# Patient Record
Sex: Male | Born: 1995
Health system: Southern US, Community
[De-identification: ages and names within clinical notes are randomized; demographics above are authoritative.]

## PROBLEM LIST (undated history)

## (undated) ENCOUNTER — Ambulatory Visit: Admission: EM

## (undated) HISTORY — PX: INGUINAL HERNIA REPAIR: SUR1180

## (undated) HISTORY — PX: WRIST FRACTURE SURGERY: SHX121

## (undated) HISTORY — PX: GANGLION CYST EXCISION: SHX1691

---

## 2018-08-15 ENCOUNTER — Encounter: Payer: Self-pay | Admitting: Emergency Medicine

## 2018-08-15 ENCOUNTER — Other Ambulatory Visit: Payer: Self-pay

## 2018-08-15 ENCOUNTER — Emergency Department
Admission: EM | Admit: 2018-08-15 | Discharge: 2018-08-15 | Disposition: A | Discharge status: Home / Self Care | Payer: Federal, State, Local not specified - PPO | Source: Home / Self Care

## 2018-08-15 ENCOUNTER — Other Ambulatory Visit (HOSPITAL_COMMUNITY)
Admission: RE | Admit: 2018-08-15 | Discharge: 2018-08-15 | Disposition: A | Discharge status: Home / Self Care | Payer: Federal, State, Local not specified - PPO | Source: Ambulatory Visit | Attending: Family Medicine | Admitting: Family Medicine

## 2018-08-15 DIAGNOSIS — N3000 Acute cystitis without hematuria: Secondary | ICD-10-CM | POA: Insufficient documentation

## 2018-08-15 DIAGNOSIS — R300 Dysuria: Secondary | ICD-10-CM | POA: Diagnosis not present

## 2018-08-15 DIAGNOSIS — Z20828 Contact with and (suspected) exposure to other viral communicable diseases: Secondary | ICD-10-CM | POA: Diagnosis not present

## 2018-08-15 LAB — POCT URINALYSIS DIP (MANUAL ENTRY)
Bilirubin, UA: NEGATIVE
Blood, UA: NEGATIVE
Glucose, UA: NEGATIVE mg/dL
Ketones, POC UA: NEGATIVE mg/dL
Nitrite, UA: NEGATIVE
Protein Ur, POC: NEGATIVE mg/dL
Spec Grav, UA: 1.015 (ref 1.010–1.025)
Urobilinogen, UA: 0.2 E.U./dL
pH, UA: 6.5 (ref 5.0–8.0)

## 2018-08-15 MED ORDER — DOXYCYCLINE HYCLATE 100 MG PO CAPS: 100.0000 mg | ORAL_CAPSULE | Freq: Two times a day (BID) | ORAL | 0 refills | Status: DC

## 2018-08-15 NOTE — ED Provider Notes (Signed)
Mark Lutz [facility]    CSN: [id number] Arrival date & time: 08/15/18  1232     History   Chief Complaint Chief Complaint  Patient presents with  . Dysuria  . Exposure to STD    HPI Mark Lutz is a 23 y.o. male.   HPI Mark Lutz is a 23 y.o. male presenting to UC with c/o 2 weeks of mild urinary frequency and discomfort with urination that worsened since yesterday.  No prior hx of UTIs but he took an at home test which showed positive for a UTI.  Pt also requested to be tested for gonorrhea and chlamydia but no known exposure. Denies fever, chills, n/v/d. Denies abdominal pain or back pain. No penile discharge.    History reviewed. No pertinent past medical history.  There are no active problems to display for this patient.   History reviewed. No pertinent surgical history.     Home Medications    Prior to Admission medications   Medication Sig Start Date End Date Taking? Authorizing Provider  doxycycline (VIBRAMYCIN) 100 MG capsule Take 1 capsule (100 mg total) by mouth 2 (two) times daily. One po bid x 7 days 08/15/18   Rolla Plate    Family History History reviewed. No pertinent family history.  Social History Social History   Tobacco Use  . Smoking status: Not on file  Substance Use Topics  . Alcohol use: Not on file  . Drug use: Not on file     Allergies   Patient has no known allergies.   Review of Systems Review of Systems  Constitutional: Negative for chills and fever.  Gastrointestinal: Negative for abdominal pain, nausea and vomiting.  Genitourinary: Positive for dysuria and frequency. Negative for discharge, flank pain, genital sores, hematuria, penile pain and testicular pain.  Musculoskeletal: Negative for back pain.     Physical Exam Triage Vital Signs ED Triage Vitals [08/15/18 1255]  Enc Vitals Group     BP 138/77     Pulse Rate (!) 52     Resp      Temp 98.4 F (36.9 C)     Temp Source Oral     SpO2 99 %      Weight 189 lb 14.4 oz (86.1 kg)     Height 6' (1.829 m)     Head Circumference      Peak Flow      Pain Score 0     Pain Loc      Pain Edu?      Excl. in GC?    No data found.  Updated Vital Signs BP 138/77 (BP Location: Left Arm)   Pulse (!) 52   Temp 98.4 F (36.9 C) (Oral)   Ht 6' (1.829 m)   Wt 189 lb 14.4 oz (86.1 kg)   SpO2 99%   BMI 25.76 kg/m   Visual Acuity Right Eye Distance:   Left Eye Distance:   Bilateral Distance:    Right Eye Near:   Left Eye Near:    Bilateral Near:     Physical Exam Vitals signs and nursing note reviewed.  Constitutional:      Appearance: He is well-developed.  HENT:     Head: Normocephalic and atraumatic.  Neck:     Musculoskeletal: Normal range of motion.  Cardiovascular:     Rate and Rhythm: Regular rhythm. Bradycardia present.  Pulmonary:     Effort: Pulmonary effort is normal.     Breath sounds: Normal breath  sounds.  Abdominal:     General: There is no distension.     Palpations: Abdomen is soft.     Tenderness: There is no abdominal tenderness. There is no right CVA tenderness or left CVA tenderness.  Musculoskeletal: Normal range of motion.  Skin:    General: Skin is warm and dry.  Neurological:     Mental Status: He is alert and oriented to person, place, and time.  Psychiatric:        Behavior: Behavior normal.      UC Treatments / Results  Labs (all labs ordered are listed, but only abnormal results are displayed) Labs Reviewed  POCT URINALYSIS DIP (MANUAL ENTRY) - Abnormal; Notable for the following components:      Result Value   Leukocytes, UA Moderate (2+) (*)    All other components within normal limits  URINE CULTURE  URINE CYTOLOGY ANCILLARY ONLY    EKG None  Radiology No results found.  Procedures Procedures (including critical care time)  Medications Ordered in UC Medications - No data to display  Initial Impression / Assessment and Plan / UC Course  I have reviewed the triage  vital signs and the nursing notes.  Pertinent labs & imaging results that were available during my care of the patient were reviewed by me and considered in my medical decision making (see chart for details).     UA c/w UTI Culture sent  Urine sent to test for gonorrhea and chlamydia Will start pt on doxycycline at this time.  Final Clinical Impressions(s) / UC Diagnoses   Final diagnoses:  Dysuria  Acute cystitis without hematuria     Discharge Instructions      Please take your antibiotic as prescribed. A urine culture has been sent to check the severity of your urinary infection and to determine if you are on the most appropriate antibiotic. The results should come back within 2-3 days and you will be notified even if no medication change is needed.  Please stay well hydrated and follow up with your family doctor in 1 week if not improving, sooner if worsening.  You will also be notified in 2-3 days of the results of your gonorrhea and chlamydia tests.     ED Prescriptions    Medication Sig Dispense Auth. Provider   doxycycline (VIBRAMYCIN) 100 MG capsule Take 1 capsule (100 mg total) by mouth 2 (two) times daily. One po bid x 7 days 14 capsule Lurene Shadow, [place]     Controlled Substance Prescriptions  Controlled Substance Registry consulted? Not Applicable   Rolla Plate 08/15/18 1527

## 2018-08-15 NOTE — ED Triage Notes (Addendum)
Here with freq urination with slight burn that started yesterday. Denies fever,chill or pain. Requesting STI check as well. Asymptomatic. Reports unprotected sexual contact.

## 2018-08-15 NOTE — Discharge Instructions (Addendum)
°  Please take your antibiotic as prescribed. A urine culture has been sent to check the severity of your urinary infection and to determine if you are on the most appropriate antibiotic. The results should come back within 2-3 days and you will be notified even if no medication change is needed.  Please stay well hydrated and follow up with your family doctor in 1 week if not improving, sooner if worsening.  You will also be notified in 2-3 days of the results of your gonorrhea and chlamydia tests.

## 2018-08-16 LAB — URINE CULTURE
MICRO NUMBER:: 344345
Result:: NO GROWTH
SPECIMEN QUALITY:: ADEQUATE

## 2018-08-18 LAB — URINE CYTOLOGY ANCILLARY ONLY
Chlamydia: NEGATIVE
Neisseria Gonorrhea: NEGATIVE

## 2018-08-19 ENCOUNTER — Telehealth: Payer: Self-pay | Admitting: *Deleted

## 2018-08-19 NOTE — Telephone Encounter (Signed)
Callback/LAbs : LMOM to return call to [facility].

## 2018-08-19 NOTE — Telephone Encounter (Signed)
Pt called back and given results

## 2019-02-16 ENCOUNTER — Emergency Department
Admission: EM | Admit: 2019-02-16 | Discharge: 2019-02-16 | Disposition: A | Discharge status: Home / Self Care | Payer: Federal, State, Local not specified - PPO | Source: Home / Self Care | Attending: Emergency Medicine | Admitting: Emergency Medicine

## 2019-02-16 ENCOUNTER — Other Ambulatory Visit: Payer: Self-pay

## 2019-02-16 DIAGNOSIS — K4090 Unilateral inguinal hernia, without obstruction or gangrene, not specified as recurrent: Secondary | ICD-10-CM

## 2019-02-16 DIAGNOSIS — K4190 Unilateral femoral hernia, without obstruction or gangrene, not specified as recurrent: Secondary | ICD-10-CM

## 2019-02-16 NOTE — ED Triage Notes (Signed)
Pt c/o about possible inguinal hernia x 6-7 weeks. Pain 2/10 Located to the left side of hip bone area.

## 2019-02-16 NOTE — ED Provider Notes (Signed)
Mark Lutz [facility]    CSN: [id number] Arrival date & time: 02/16/19  1210      History   Chief Complaint Chief Complaint  Patient presents with  . Inguinal Hernia    HPI Mark Lutz is a 23 y.o. male.   HPI For the past 6 to 7 weeks, has had soreness left inguinal area, worse with physical exercise, lifting and playing soccer.  Pain started 2 days ago, mildly worse, now 2 out of 10 intensity.  No radiation.  He felt a bulge over the sore area left inguinal area.  No other abdominal symptoms.  No distention, nausea, vomiting, change of bowel habits, fever, chills, chest pain, shortness of breath, rash. He denies any GU symptoms.  No dysuria or hematuria or urethral discharge.  Monogamous.  Partner asymptomatic.  He was seen for dysuria 6 months ago that since resolved without sequelae. No past medical history on file. Past medical history otherwise negative.  No history of diabetes or hypertension. There are no active problems to display for this patient.   No past surgical history on file.  Past surgical history negative   Home Medications    Prior to Admission medications   Not on File    Family History No family history on file. Family history not noted by patient Social History Social History   Tobacco Use  . Smoking status: Never Smoker  . Smokeless tobacco: Never Used  Substance Use Topics  . Alcohol use: Yes    Comment: occ  . Drug use: Not on file   Denies ever using tobacco  Allergies   Patient has no known allergies.   Review of Systems Review of Systems  All other systems reviewed and are negative.    Physical Exam Triage Vital Signs ED Triage Vitals [02/16/19 1235]  Enc Vitals Group     BP 125/73     Pulse Rate (!) 58     Resp 18     Temp 98.3 F (36.8 C)     Temp Source Oral     SpO2 99 %     Weight 191 lb (86.6 kg)     Height 6' (1.829 m)     Head Circumference      Peak Flow      Pain Score 2     Pain Loc      Pain  Edu?      Excl. in GC?    No data found.  Updated Vital Signs BP 125/73 (BP Location: Right Arm)   Pulse (!) 58   Temp 98.3 F (36.8 C) (Oral)   Resp 18   Ht 6' (1.829 m)   Wt 86.6 kg   SpO2 99%   BMI 25.90 kg/m    Physical Exam Vitals signs reviewed.  Constitutional:      General: He is not in acute distress.    Appearance: He is well-developed.     Comments: Pleasant, cooperative male, no distress  HENT:     Head: Normocephalic and atraumatic.  Eyes:     General: No scleral icterus.    Pupils: Pupils are equal, round, and reactive to light.  Neck:     Musculoskeletal: Normal range of motion and neck supple.  Cardiovascular:     Rate and Rhythm: Normal rate and regular rhythm.  Pulmonary:     Effort: Pulmonary effort is normal.  Abdominal:     General: Abdomen is flat. There is no distension.     Palpations:  Abdomen is soft.     Tenderness: There is no abdominal tenderness. There is no right CVA tenderness, left CVA tenderness, guarding or rebound.     Hernia: A hernia is present. Hernia is present in the left inguinal area. There is no hernia in the right inguinal area.     Comments: No hepatosplenomegaly. No inguinal adenopathy  Genitourinary:    Pubic Area: No rash.      Penis: Normal and circumcised. No tenderness, swelling or lesions.      Scrotum/Testes: Normal. Cremasteric reflex is present.        Right: Mass or tenderness not present.        Left: Testicular hydrocele or varicocele not present.     Epididymis:     Right: Normal.     Left: Normal.       Comments: As shown, there is a reducible bulge/hernia in the L femoral aspect, consistent with a femoral hernia or an indirect hernia. There is also a reducible hernia in the left inguinal canal that is tender. No testicular swelling or tenderness or redness or testicular mass noted. Musculoskeletal:     Right lower leg: No edema.     Left lower leg: No edema.     Comments: Full range of motion. No  bony tenderness  Skin:    General: Skin is warm and dry.     Capillary Refill: Capillary refill takes less than 2 seconds.     Findings: No rash.  Neurological:     General: No focal deficit present.     Mental Status: He is alert and oriented to person, place, and time.     Cranial Nerves: No cranial nerve deficit.  Psychiatric:        Behavior: Behavior normal.      UC Treatments / Results  Labs (all labs ordered are listed, but only abnormal results are displayed) Labs Reviewed - No data to display   Radiology No results found.  Procedures Procedures (including critical care time)  Medications Ordered in UC Medications - No data to display  Initial Impression / Assessment and Plan / UC Course  I have reviewed the triage vital signs and the nursing notes.  Pertinent labs & imaging results that were available during my care of the patient were reviewed by me and considered in my medical decision making (see chart for details).    Likely left femoral and inguinal hernia, reducible. Names and phone numbers of general surgeons given, and he will make his own referral appointment within the next few days.  Explained the importance of this and red flags to look out for and what to do if any red flags occur. He declined any prescription pain med.  May use OTC Tylenol or ibuprofen if needed. Questions invited and answered.  He voiced understanding He declined AVS  Final Clinical Impressions(s) / UC Diagnoses   Final diagnoses:  Left inguinal hernia  Left femoral hernia without obstruction or gangrene     Jacqulyn Cane, MD 02/17/19 2120

## 2019-02-18 ENCOUNTER — Ambulatory Visit: Payer: Federal, State, Local not specified - PPO | Admitting: Physician Assistant

## 2019-02-19 DIAGNOSIS — K4090 Unilateral inguinal hernia, without obstruction or gangrene, not specified as recurrent: Secondary | ICD-10-CM | POA: Diagnosis not present

## 2019-02-26 DIAGNOSIS — K4090 Unilateral inguinal hernia, without obstruction or gangrene, not specified as recurrent: Secondary | ICD-10-CM | POA: Insufficient documentation

## 2019-03-13 DIAGNOSIS — Z01818 Encounter for other preprocedural examination: Secondary | ICD-10-CM | POA: Diagnosis not present

## 2019-03-17 DIAGNOSIS — Z79899 Other long term (current) drug therapy: Secondary | ICD-10-CM | POA: Diagnosis not present

## 2019-03-17 DIAGNOSIS — K4090 Unilateral inguinal hernia, without obstruction or gangrene, not specified as recurrent: Secondary | ICD-10-CM | POA: Diagnosis not present

## 2020-07-19 DIAGNOSIS — Y998 Other external cause status: Secondary | ICD-10-CM | POA: Diagnosis not present

## 2020-07-19 DIAGNOSIS — W260XXA Contact with knife, initial encounter: Secondary | ICD-10-CM | POA: Diagnosis not present

## 2020-07-19 DIAGNOSIS — S6992XA Unspecified injury of left wrist, hand and finger(s), initial encounter: Secondary | ICD-10-CM | POA: Diagnosis not present

## 2020-09-13 DIAGNOSIS — M25531 Pain in right wrist: Secondary | ICD-10-CM | POA: Diagnosis not present

## 2020-10-11 DIAGNOSIS — M25531 Pain in right wrist: Secondary | ICD-10-CM | POA: Diagnosis not present

## 2020-11-02 ENCOUNTER — Other Ambulatory Visit: Payer: Self-pay

## 2020-11-02 ENCOUNTER — Ambulatory Visit (INDEPENDENT_AMBULATORY_CARE_PROVIDER_SITE_OTHER): Payer: Federal, State, Local not specified - PPO | Admitting: Sports Medicine

## 2020-11-02 DIAGNOSIS — M25531 Pain in right wrist: Secondary | ICD-10-CM | POA: Diagnosis not present

## 2020-11-02 DIAGNOSIS — G8929 Other chronic pain: Secondary | ICD-10-CM

## 2020-11-02 NOTE — Progress Notes (Signed)
    Procedures performed today:    None.  Independent interpretation of notes and tests performed by another provider:   None.  Brief History, Exam, Impression, and Recommendations:    Chronic pain of right wrist Mark Lutz is a pleasant 25 year old male, 1 year ago he fell onto an outstretched right hand, had immediate pain, this hurt him for over a year, he does have some mechanical symptoms, instability. He was seen by an orthopedic surgeon a few weeks ago, he had some x-rays that were unremarkable and received a STT joint injection without any improvement. He does have a positive Watson's test, he has a positive lunotriquetral shuck test. I do think he has either a scapholunate ligament tear, lunotriquetral ligament tear or both. For this reason we do need to proceed with MR arthrography. Return to see me for MR arthrogram injection.    ___________________________________________ Ihor Austin. Benjamin Stain, M.D., ABFM., CAQSM. Primary Care and Sports Medicine [facility]  Adjunct Instructor of Family Medicine  [employer]

## 2020-11-02 NOTE — Assessment & Plan Note (Signed)
Mark Lutz is a pleasant 25 year old male, 1 year ago he fell onto an outstretched right hand, had immediate pain, this hurt him for over a year, he does have some mechanical symptoms, instability. He was seen by an orthopedic surgeon a few weeks ago, he had some x-rays that were unremarkable and received a STT joint injection without any improvement. He does have a positive Watson's test, he has a positive lunotriquetral shuck test. I do think he has either a scapholunate ligament tear, lunotriquetral ligament tear or both. For this reason we do need to proceed with MR arthrography. Return to see me for MR arthrogram injection.

## 2020-11-20 ENCOUNTER — Ambulatory Visit (INDEPENDENT_AMBULATORY_CARE_PROVIDER_SITE_OTHER): Payer: Federal, State, Local not specified - PPO

## 2020-11-20 ENCOUNTER — Ambulatory Visit: Payer: Federal, State, Local not specified - PPO | Admitting: Sports Medicine

## 2020-11-20 ENCOUNTER — Other Ambulatory Visit: Payer: Self-pay

## 2020-11-20 DIAGNOSIS — G8929 Other chronic pain: Secondary | ICD-10-CM

## 2020-11-20 DIAGNOSIS — M25531 Pain in right wrist: Secondary | ICD-10-CM

## 2020-11-20 DIAGNOSIS — R600 Localized edema: Secondary | ICD-10-CM | POA: Diagnosis not present

## 2020-11-20 MED ORDER — GADOBUTROL 1 MMOL/ML IV SOLN
1.0000 mL | Freq: Once | INTRAVENOUS | Status: AC | PRN
  Administered 2020-11-20: 1 mL via INTRAVENOUS

## 2020-11-20 NOTE — Assessment & Plan Note (Addendum)
25 year old male, 1 year ago fall onto outstretched hand with immediate pain, pain over a year, recently saw an orthopedic surgeon and had an STT injection without significant improvement, due to positive exam findings including a positive Watson's test, lunotriquetral shuck test, we are concerned about lunotriquetral or scapholunate ligament injury, injection performed today for MR arthrography, treatment will depend on MRI results.  Update: I personally reviewed the MRI, there does appear to be some attenuation at the scapholunate ligament with some contrast in the intercarpal joints.  I would like hand surgery to weigh in.

## 2020-11-20 NOTE — Progress Notes (Addendum)
    Procedures performed today:    Procedure: Real-time Ultrasound Guided gadolinium contrast injection of right wrist joint, radiocarpal Device: Samsung HS60  Verbal informed consent obtained.  Time-out conducted.  Noted no overlying erythema, induration, or other signs of local infection.  Skin prepped in a sterile fashion.  Local anesthesia: Topical Ethyl chloride.  With sterile technique and under real time ultrasound guidance: I advanced a 25-gauge needle into the radiocarpal joint, I then injected 1 cc kenalog 40, 1 cc lidocaine, syringe switched and 0.05 cc gadolinium injected, syringe again switched and 1 to 2 mL of saline used to fully distend the joint. Joint visualized and capsule seen distending confirming intra-articular placement of contrast material and medication. Completed without difficulty  Advised to call if fevers/chills, erythema, induration, drainage, or persistent bleeding.  Images permanently stored in PACS Impression: Technically successful ultrasound guided gadolinium contrast injection for MR arthrography.  Please see separate MR arthrogram report.   Independent interpretation of notes and tests performed by another provider:   I personally reviewed the MRI, there does appear to be some attenuation at the scapholunate ligament with some contrast in the intercarpal joints.  Brief History, Exam, Impression, and Recommendations:    Chronic pain of right wrist 25 year old male, 1 year ago fall onto outstretched hand with immediate pain, pain over a year, recently saw an orthopedic surgeon and had an STT injection without significant improvement, due to positive exam findings including a positive Watson's test, lunotriquetral shuck test, we are concerned about lunotriquetral or scapholunate ligament injury, injection performed today for MR arthrography, treatment will depend on MRI results.  Update: I personally reviewed the MRI, there does appear to be some  attenuation at the scapholunate ligament with some contrast in the intercarpal joints.  I would like hand surgery to weigh in.    ___________________________________________ Ihor Austin. Benjamin Stain, M.D., ABFM., CAQSM. Primary Care and Sports Medicine [facility]  Adjunct Instructor of Family Medicine  [employer]

## 2020-11-22 NOTE — Addendum Note (Signed)
Addended by: Monica Becton on: 11/22/2020 11:52 AM   Modules accepted: Orders

## 2021-01-04 DIAGNOSIS — M9904 Segmental and somatic dysfunction of sacral region: Secondary | ICD-10-CM | POA: Diagnosis not present

## 2021-01-04 DIAGNOSIS — M9901 Segmental and somatic dysfunction of cervical region: Secondary | ICD-10-CM | POA: Diagnosis not present

## 2021-01-04 DIAGNOSIS — M9903 Segmental and somatic dysfunction of lumbar region: Secondary | ICD-10-CM | POA: Diagnosis not present

## 2021-01-04 DIAGNOSIS — M9902 Segmental and somatic dysfunction of thoracic region: Secondary | ICD-10-CM | POA: Diagnosis not present

## 2021-01-15 DIAGNOSIS — M47816 Spondylosis without myelopathy or radiculopathy, lumbar region: Secondary | ICD-10-CM | POA: Diagnosis not present

## 2021-01-15 DIAGNOSIS — M47818 Spondylosis without myelopathy or radiculopathy, sacral and sacrococcygeal region: Secondary | ICD-10-CM | POA: Diagnosis not present

## 2021-01-15 DIAGNOSIS — M47814 Spondylosis without myelopathy or radiculopathy, thoracic region: Secondary | ICD-10-CM | POA: Diagnosis not present

## 2021-01-15 DIAGNOSIS — M4721 Other spondylosis with radiculopathy, occipito-atlanto-axial region: Secondary | ICD-10-CM | POA: Diagnosis not present

## 2021-01-16 DIAGNOSIS — M47818 Spondylosis without myelopathy or radiculopathy, sacral and sacrococcygeal region: Secondary | ICD-10-CM | POA: Diagnosis not present

## 2021-01-16 DIAGNOSIS — M4721 Other spondylosis with radiculopathy, occipito-atlanto-axial region: Secondary | ICD-10-CM | POA: Diagnosis not present

## 2021-01-16 DIAGNOSIS — M47814 Spondylosis without myelopathy or radiculopathy, thoracic region: Secondary | ICD-10-CM | POA: Diagnosis not present

## 2021-01-16 DIAGNOSIS — M47816 Spondylosis without myelopathy or radiculopathy, lumbar region: Secondary | ICD-10-CM | POA: Diagnosis not present

## 2021-01-17 DIAGNOSIS — M47818 Spondylosis without myelopathy or radiculopathy, sacral and sacrococcygeal region: Secondary | ICD-10-CM | POA: Diagnosis not present

## 2021-01-17 DIAGNOSIS — M4721 Other spondylosis with radiculopathy, occipito-atlanto-axial region: Secondary | ICD-10-CM | POA: Diagnosis not present

## 2021-01-17 DIAGNOSIS — M47814 Spondylosis without myelopathy or radiculopathy, thoracic region: Secondary | ICD-10-CM | POA: Diagnosis not present

## 2021-01-17 DIAGNOSIS — M47816 Spondylosis without myelopathy or radiculopathy, lumbar region: Secondary | ICD-10-CM | POA: Diagnosis not present

## 2021-01-18 DIAGNOSIS — M47816 Spondylosis without myelopathy or radiculopathy, lumbar region: Secondary | ICD-10-CM | POA: Diagnosis not present

## 2021-01-18 DIAGNOSIS — M4721 Other spondylosis with radiculopathy, occipito-atlanto-axial region: Secondary | ICD-10-CM | POA: Diagnosis not present

## 2021-01-18 DIAGNOSIS — M47814 Spondylosis without myelopathy or radiculopathy, thoracic region: Secondary | ICD-10-CM | POA: Diagnosis not present

## 2021-01-18 DIAGNOSIS — M47818 Spondylosis without myelopathy or radiculopathy, sacral and sacrococcygeal region: Secondary | ICD-10-CM | POA: Diagnosis not present

## 2021-01-23 DIAGNOSIS — M47818 Spondylosis without myelopathy or radiculopathy, sacral and sacrococcygeal region: Secondary | ICD-10-CM | POA: Diagnosis not present

## 2021-01-23 DIAGNOSIS — M47814 Spondylosis without myelopathy or radiculopathy, thoracic region: Secondary | ICD-10-CM | POA: Diagnosis not present

## 2021-01-23 DIAGNOSIS — M47816 Spondylosis without myelopathy or radiculopathy, lumbar region: Secondary | ICD-10-CM | POA: Diagnosis not present

## 2021-01-23 DIAGNOSIS — M4721 Other spondylosis with radiculopathy, occipito-atlanto-axial region: Secondary | ICD-10-CM | POA: Diagnosis not present

## 2021-01-24 DIAGNOSIS — M47814 Spondylosis without myelopathy or radiculopathy, thoracic region: Secondary | ICD-10-CM | POA: Diagnosis not present

## 2021-01-24 DIAGNOSIS — M47816 Spondylosis without myelopathy or radiculopathy, lumbar region: Secondary | ICD-10-CM | POA: Diagnosis not present

## 2021-01-24 DIAGNOSIS — M47818 Spondylosis without myelopathy or radiculopathy, sacral and sacrococcygeal region: Secondary | ICD-10-CM | POA: Diagnosis not present

## 2021-01-24 DIAGNOSIS — M4721 Other spondylosis with radiculopathy, occipito-atlanto-axial region: Secondary | ICD-10-CM | POA: Diagnosis not present

## 2021-01-30 DIAGNOSIS — M47814 Spondylosis without myelopathy or radiculopathy, thoracic region: Secondary | ICD-10-CM | POA: Diagnosis not present

## 2021-01-30 DIAGNOSIS — M47818 Spondylosis without myelopathy or radiculopathy, sacral and sacrococcygeal region: Secondary | ICD-10-CM | POA: Diagnosis not present

## 2021-01-30 DIAGNOSIS — M47816 Spondylosis without myelopathy or radiculopathy, lumbar region: Secondary | ICD-10-CM | POA: Diagnosis not present

## 2021-01-30 DIAGNOSIS — M4721 Other spondylosis with radiculopathy, occipito-atlanto-axial region: Secondary | ICD-10-CM | POA: Diagnosis not present

## 2021-02-01 DIAGNOSIS — M47816 Spondylosis without myelopathy or radiculopathy, lumbar region: Secondary | ICD-10-CM | POA: Diagnosis not present

## 2021-02-01 DIAGNOSIS — M47818 Spondylosis without myelopathy or radiculopathy, sacral and sacrococcygeal region: Secondary | ICD-10-CM | POA: Diagnosis not present

## 2021-02-01 DIAGNOSIS — M4721 Other spondylosis with radiculopathy, occipito-atlanto-axial region: Secondary | ICD-10-CM | POA: Diagnosis not present

## 2021-02-01 DIAGNOSIS — M47814 Spondylosis without myelopathy or radiculopathy, thoracic region: Secondary | ICD-10-CM | POA: Diagnosis not present

## 2021-02-05 DIAGNOSIS — M25531 Pain in right wrist: Secondary | ICD-10-CM | POA: Diagnosis not present

## 2021-02-05 DIAGNOSIS — M47818 Spondylosis without myelopathy or radiculopathy, sacral and sacrococcygeal region: Secondary | ICD-10-CM | POA: Diagnosis not present

## 2021-02-05 DIAGNOSIS — M47816 Spondylosis without myelopathy or radiculopathy, lumbar region: Secondary | ICD-10-CM | POA: Diagnosis not present

## 2021-02-05 DIAGNOSIS — M47814 Spondylosis without myelopathy or radiculopathy, thoracic region: Secondary | ICD-10-CM | POA: Diagnosis not present

## 2021-02-05 DIAGNOSIS — M4721 Other spondylosis with radiculopathy, occipito-atlanto-axial region: Secondary | ICD-10-CM | POA: Diagnosis not present

## 2021-02-13 DIAGNOSIS — M4721 Other spondylosis with radiculopathy, occipito-atlanto-axial region: Secondary | ICD-10-CM | POA: Diagnosis not present

## 2021-02-13 DIAGNOSIS — M47816 Spondylosis without myelopathy or radiculopathy, lumbar region: Secondary | ICD-10-CM | POA: Diagnosis not present

## 2021-02-13 DIAGNOSIS — M47814 Spondylosis without myelopathy or radiculopathy, thoracic region: Secondary | ICD-10-CM | POA: Diagnosis not present

## 2021-02-13 DIAGNOSIS — M47818 Spondylosis without myelopathy or radiculopathy, sacral and sacrococcygeal region: Secondary | ICD-10-CM | POA: Diagnosis not present

## 2021-02-15 DIAGNOSIS — M47818 Spondylosis without myelopathy or radiculopathy, sacral and sacrococcygeal region: Secondary | ICD-10-CM | POA: Diagnosis not present

## 2021-02-15 DIAGNOSIS — M47814 Spondylosis without myelopathy or radiculopathy, thoracic region: Secondary | ICD-10-CM | POA: Diagnosis not present

## 2021-02-15 DIAGNOSIS — M47816 Spondylosis without myelopathy or radiculopathy, lumbar region: Secondary | ICD-10-CM | POA: Diagnosis not present

## 2021-02-15 DIAGNOSIS — M4721 Other spondylosis with radiculopathy, occipito-atlanto-axial region: Secondary | ICD-10-CM | POA: Diagnosis not present

## 2021-02-17 DIAGNOSIS — M25531 Pain in right wrist: Secondary | ICD-10-CM | POA: Diagnosis not present

## 2021-02-22 DIAGNOSIS — M25839 Other specified joint disorders, unspecified wrist: Secondary | ICD-10-CM | POA: Insufficient documentation

## 2021-02-22 DIAGNOSIS — M67431 Ganglion, right wrist: Secondary | ICD-10-CM | POA: Insufficient documentation

## 2021-02-22 DIAGNOSIS — M25531 Pain in right wrist: Secondary | ICD-10-CM | POA: Diagnosis not present

## 2021-02-27 DIAGNOSIS — M4721 Other spondylosis with radiculopathy, occipito-atlanto-axial region: Secondary | ICD-10-CM | POA: Diagnosis not present

## 2021-02-27 DIAGNOSIS — M47814 Spondylosis without myelopathy or radiculopathy, thoracic region: Secondary | ICD-10-CM | POA: Diagnosis not present

## 2021-02-27 DIAGNOSIS — M47816 Spondylosis without myelopathy or radiculopathy, lumbar region: Secondary | ICD-10-CM | POA: Diagnosis not present

## 2021-02-27 DIAGNOSIS — M47818 Spondylosis without myelopathy or radiculopathy, sacral and sacrococcygeal region: Secondary | ICD-10-CM | POA: Diagnosis not present

## 2021-03-07 DIAGNOSIS — M47816 Spondylosis without myelopathy or radiculopathy, lumbar region: Secondary | ICD-10-CM | POA: Diagnosis not present

## 2021-03-07 DIAGNOSIS — M4721 Other spondylosis with radiculopathy, occipito-atlanto-axial region: Secondary | ICD-10-CM | POA: Diagnosis not present

## 2021-03-07 DIAGNOSIS — M47818 Spondylosis without myelopathy or radiculopathy, sacral and sacrococcygeal region: Secondary | ICD-10-CM | POA: Diagnosis not present

## 2021-03-07 DIAGNOSIS — M47814 Spondylosis without myelopathy or radiculopathy, thoracic region: Secondary | ICD-10-CM | POA: Diagnosis not present

## 2021-03-15 DIAGNOSIS — M4721 Other spondylosis with radiculopathy, occipito-atlanto-axial region: Secondary | ICD-10-CM | POA: Diagnosis not present

## 2021-03-15 DIAGNOSIS — M47818 Spondylosis without myelopathy or radiculopathy, sacral and sacrococcygeal region: Secondary | ICD-10-CM | POA: Diagnosis not present

## 2021-03-15 DIAGNOSIS — M47814 Spondylosis without myelopathy or radiculopathy, thoracic region: Secondary | ICD-10-CM | POA: Diagnosis not present

## 2021-03-15 DIAGNOSIS — M47816 Spondylosis without myelopathy or radiculopathy, lumbar region: Secondary | ICD-10-CM | POA: Diagnosis not present

## 2021-03-28 DIAGNOSIS — M4721 Other spondylosis with radiculopathy, occipito-atlanto-axial region: Secondary | ICD-10-CM | POA: Diagnosis not present

## 2021-03-28 DIAGNOSIS — M47818 Spondylosis without myelopathy or radiculopathy, sacral and sacrococcygeal region: Secondary | ICD-10-CM | POA: Diagnosis not present

## 2021-03-28 DIAGNOSIS — M47816 Spondylosis without myelopathy or radiculopathy, lumbar region: Secondary | ICD-10-CM | POA: Diagnosis not present

## 2021-03-28 DIAGNOSIS — M47814 Spondylosis without myelopathy or radiculopathy, thoracic region: Secondary | ICD-10-CM | POA: Diagnosis not present

## 2021-04-04 DIAGNOSIS — M4721 Other spondylosis with radiculopathy, occipito-atlanto-axial region: Secondary | ICD-10-CM | POA: Diagnosis not present

## 2021-04-04 DIAGNOSIS — M47816 Spondylosis without myelopathy or radiculopathy, lumbar region: Secondary | ICD-10-CM | POA: Diagnosis not present

## 2021-04-04 DIAGNOSIS — M47814 Spondylosis without myelopathy or radiculopathy, thoracic region: Secondary | ICD-10-CM | POA: Diagnosis not present

## 2021-04-04 DIAGNOSIS — M47818 Spondylosis without myelopathy or radiculopathy, sacral and sacrococcygeal region: Secondary | ICD-10-CM | POA: Diagnosis not present

## 2021-04-05 DIAGNOSIS — M25531 Pain in right wrist: Secondary | ICD-10-CM | POA: Diagnosis not present

## 2021-04-05 DIAGNOSIS — M67431 Ganglion, right wrist: Secondary | ICD-10-CM | POA: Diagnosis not present

## 2021-04-05 DIAGNOSIS — M25839 Other specified joint disorders, unspecified wrist: Secondary | ICD-10-CM | POA: Diagnosis not present

## 2021-04-17 DIAGNOSIS — M47814 Spondylosis without myelopathy or radiculopathy, thoracic region: Secondary | ICD-10-CM | POA: Diagnosis not present

## 2021-04-17 DIAGNOSIS — M4721 Other spondylosis with radiculopathy, occipito-atlanto-axial region: Secondary | ICD-10-CM | POA: Diagnosis not present

## 2021-04-17 DIAGNOSIS — M47816 Spondylosis without myelopathy or radiculopathy, lumbar region: Secondary | ICD-10-CM | POA: Diagnosis not present

## 2021-04-17 DIAGNOSIS — M47818 Spondylosis without myelopathy or radiculopathy, sacral and sacrococcygeal region: Secondary | ICD-10-CM | POA: Diagnosis not present

## 2021-04-30 DIAGNOSIS — K4090 Unilateral inguinal hernia, without obstruction or gangrene, not specified as recurrent: Secondary | ICD-10-CM | POA: Diagnosis not present

## 2021-05-03 DIAGNOSIS — R1032 Left lower quadrant pain: Secondary | ICD-10-CM | POA: Diagnosis not present

## 2021-05-29 DIAGNOSIS — R2231 Localized swelling, mass and lump, right upper limb: Secondary | ICD-10-CM | POA: Diagnosis not present

## 2021-05-29 DIAGNOSIS — M79631 Pain in right forearm: Secondary | ICD-10-CM | POA: Diagnosis not present

## 2021-05-29 DIAGNOSIS — M659 Synovitis and tenosynovitis, unspecified: Secondary | ICD-10-CM | POA: Diagnosis not present

## 2021-05-29 DIAGNOSIS — D2111 Benign neoplasm of connective and other soft tissue of right upper limb, including shoulder: Secondary | ICD-10-CM | POA: Diagnosis not present

## 2021-05-30 DIAGNOSIS — M47816 Spondylosis without myelopathy or radiculopathy, lumbar region: Secondary | ICD-10-CM | POA: Diagnosis not present

## 2021-05-30 DIAGNOSIS — M47818 Spondylosis without myelopathy or radiculopathy, sacral and sacrococcygeal region: Secondary | ICD-10-CM | POA: Diagnosis not present

## 2021-05-30 DIAGNOSIS — M47814 Spondylosis without myelopathy or radiculopathy, thoracic region: Secondary | ICD-10-CM | POA: Diagnosis not present

## 2021-05-30 DIAGNOSIS — M4721 Other spondylosis with radiculopathy, occipito-atlanto-axial region: Secondary | ICD-10-CM | POA: Diagnosis not present

## 2021-06-19 DIAGNOSIS — M25631 Stiffness of right wrist, not elsewhere classified: Secondary | ICD-10-CM | POA: Diagnosis not present

## 2021-06-28 DIAGNOSIS — M25631 Stiffness of right wrist, not elsewhere classified: Secondary | ICD-10-CM | POA: Diagnosis not present

## 2021-07-03 DIAGNOSIS — M25631 Stiffness of right wrist, not elsewhere classified: Secondary | ICD-10-CM | POA: Diagnosis not present

## 2021-07-09 DIAGNOSIS — M25631 Stiffness of right wrist, not elsewhere classified: Secondary | ICD-10-CM | POA: Diagnosis not present

## 2021-07-17 DIAGNOSIS — M25631 Stiffness of right wrist, not elsewhere classified: Secondary | ICD-10-CM | POA: Diagnosis not present

## 2021-07-23 DIAGNOSIS — M25631 Stiffness of right wrist, not elsewhere classified: Secondary | ICD-10-CM | POA: Diagnosis not present

## 2021-08-02 DIAGNOSIS — M25631 Stiffness of right wrist, not elsewhere classified: Secondary | ICD-10-CM | POA: Diagnosis not present

## 2021-11-02 DIAGNOSIS — M25531 Pain in right wrist: Secondary | ICD-10-CM | POA: Diagnosis not present

## 2022-03-13 IMAGING — MR MR WRIST*R* W/ CM
4 of 6 series · 26 of 40 positions shown · IV contrast (agent unspecified)
Comparison: None.

CONTRAST:  1mL GADAVIST GADOBUTROL 1 MMOL/ML IV SOLN

CLINICAL DATA: Status post fall skateboarding. Lateral wrist pain.

EXAM:
MR OF THE RIGHT WRIST WITH CONTRAST
TECHNIQUE: Multiplanar, multisequence MR imaging of the right wrist was
performed following the administration of intravenous contrast.

[Series 3: T1 fat-sat · axial · 3.0mm · 0.20mm/px · z∈[-33,+46]mm · 7 of 25 slices shown (1 of 2)]
[im 1/25]
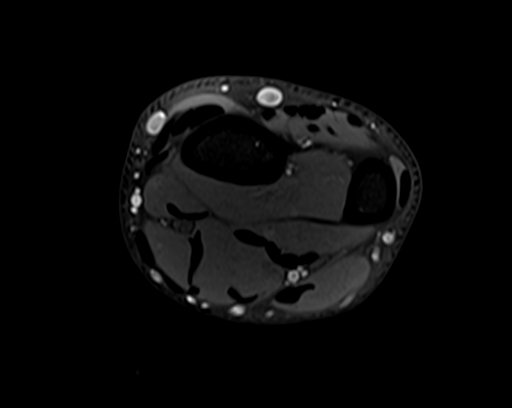
[im 5/25]
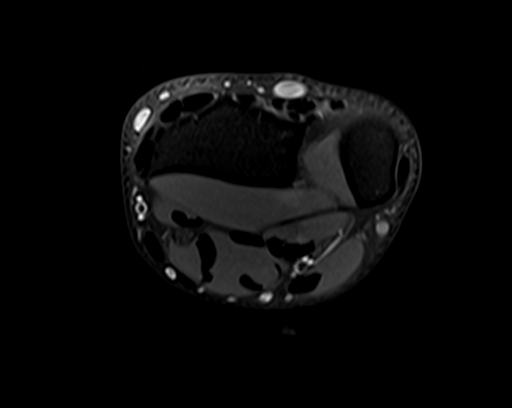
[im 9/25]
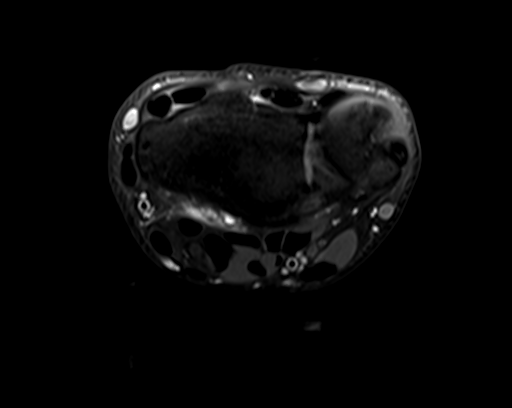
[im 13/25]
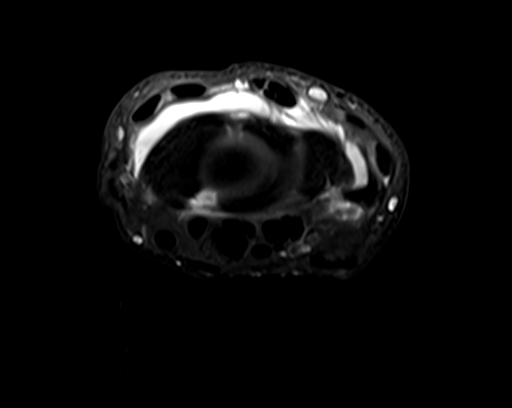
[im 17/25]
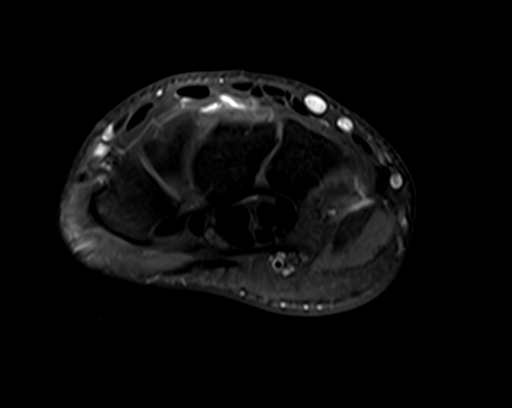
[im 21/25]
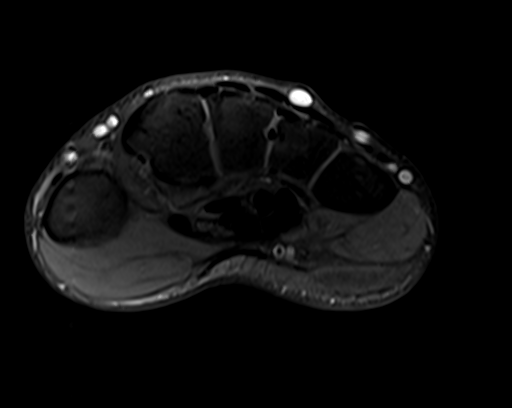
[im 25/25]
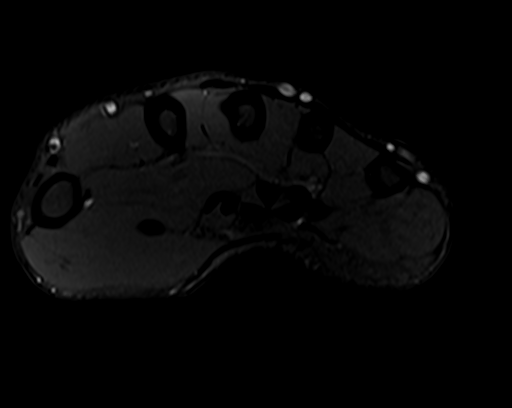

[Series 4: T2 fat-sat · axial · 3.0mm · 0.39mm/px · z∈[-33,+46]mm · 7 of 25 slices shown (1 of 2)]
[im 1/25]
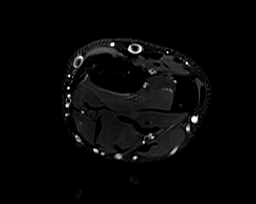
[im 5/25]
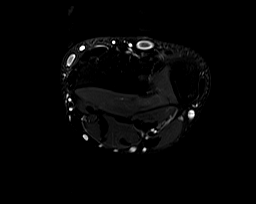
[im 9/25]
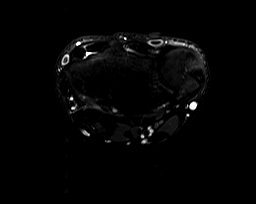
[im 13/25]
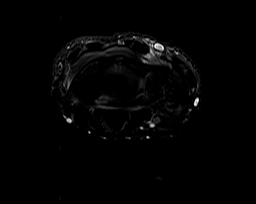
[im 17/25]
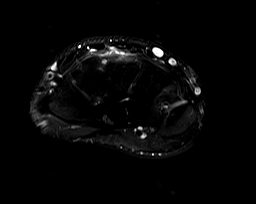
[im 21/25]
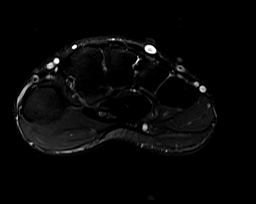
[im 25/25]
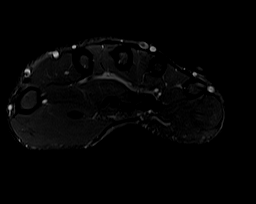

[Series 5: T1 fat-sat · coronal · 3.0mm · 0.20mm/px · 6 of 18 slices shown (2 of 2)]
[im 1/18]
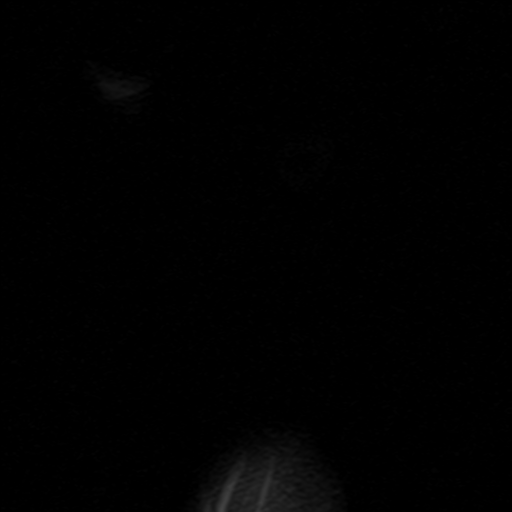
[im 4/18]
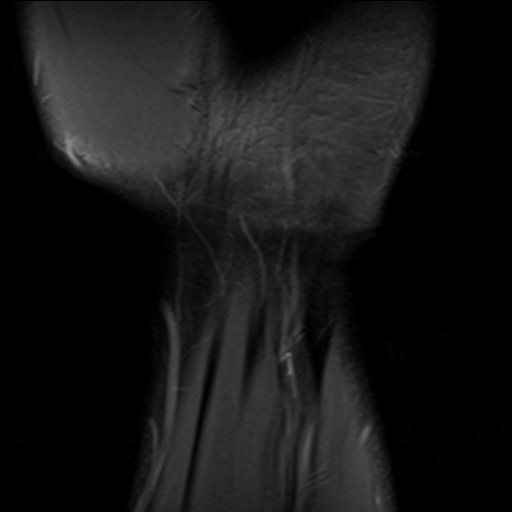
[im 7/18]
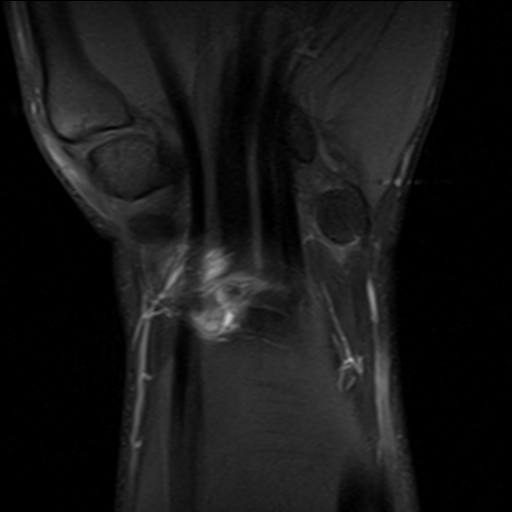
[im 11/18]
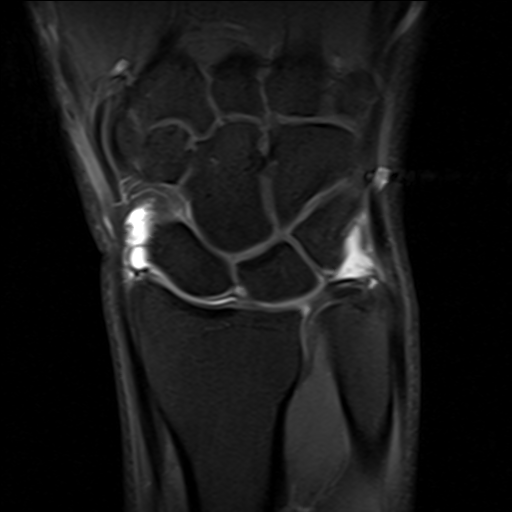
[im 14/18]
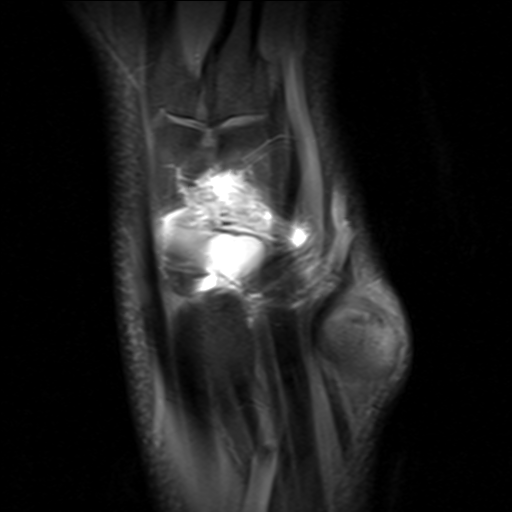
[im 18/18]
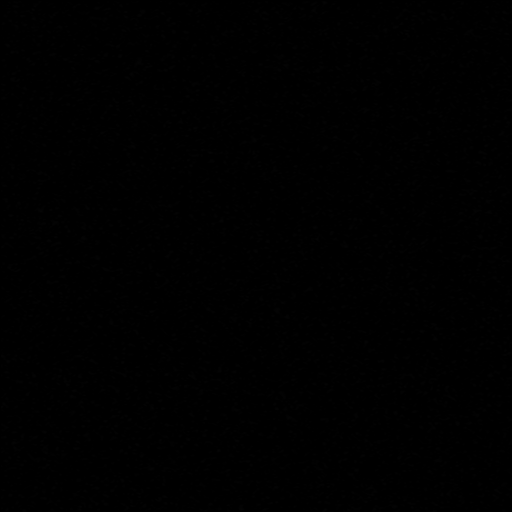

[Series 7: T2 fat-sat · coronal · 3.0mm · 0.20mm/px · 6 of 18 slices shown (2 of 2)]
[im 1/18]
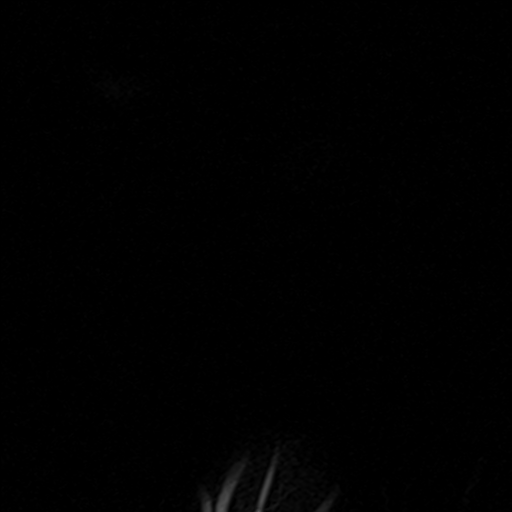
[im 4/18]
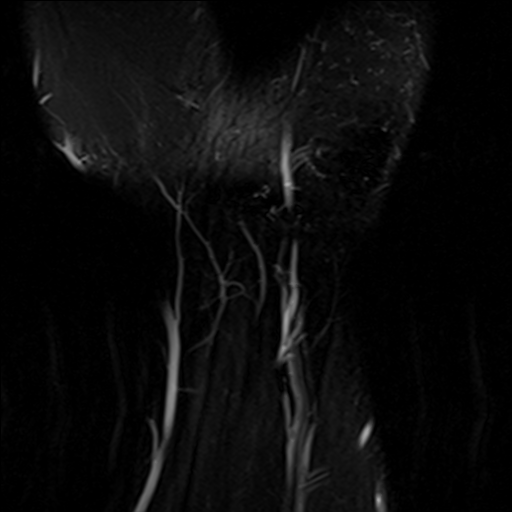
[im 7/18]
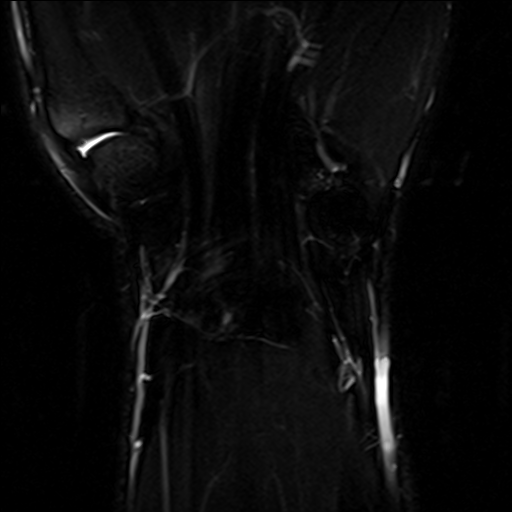
[im 11/18]
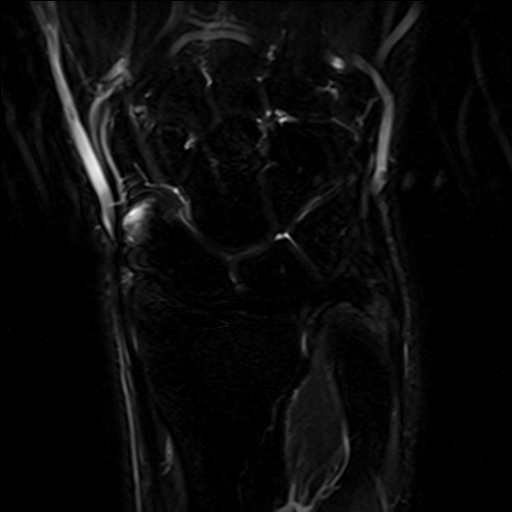
[im 14/18]
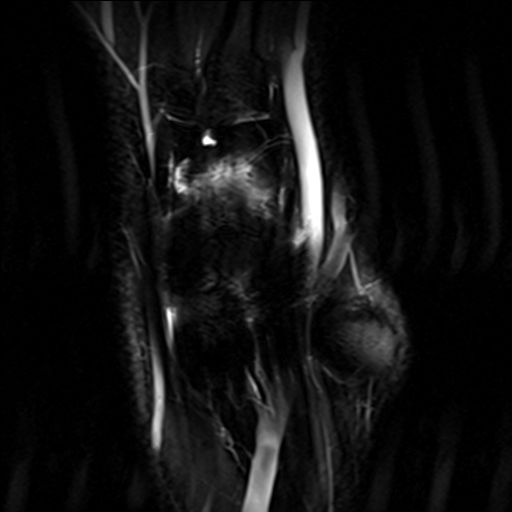
[im 18/18]
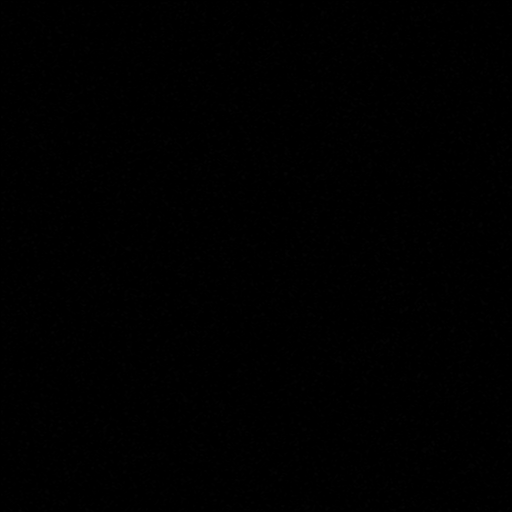

[26 of 40 positions shown; findings below may reference images not displayed]

FINDINGS: Ligaments: Extrinsic ligaments are intact. Scapholunate and
lunotriquetral ligaments are intact.

Triangular fibrocartilage: Intact

Tendons: Flexor compartment tendons are intact. Moderate tendinosis
of the extensor carpi ulnaris tendon with mild marrow edema in the
ulnar styloid process. Remainder of the extensor compartment tendons
are intact.

Carpal tunnel/median nerve: Flexor retinaculum is intact. Normal
carpal tunnel without a mass. Median nerve demonstrates normal
signal and caliber.

Guyon's canal: Normal Guyon's canal. Normal ulnar nerve.

Joint/cartilage: No chondral defect. Intraarticular contrast in the
radiocarpal joint. No midcarpal joint effusion. No distal radioulnar
joint effusion.

Bones/carpal alignment: No fracture, avascular necrosis, or osseous
lesion. Normal alignment.

Other: Muscles are normal. No fluid collection, hematoma, or soft
tissue mass.
IMPRESSION: 1. Moderate tendinosis of the extensor carpi ulnaris tendon with
mild marrow edema in the ulnar styloid process.
2. No acute osseous injury of the right wrist.

## 2023-09-21 ENCOUNTER — Ambulatory Visit
Admission: EM | Admit: 2023-09-21 | Discharge: 2023-09-21 | Disposition: A | Discharge status: Home / Self Care | Payer: PRIVATE HEALTH INSURANCE | Attending: Family Medicine | Admitting: Family Medicine

## 2023-09-21 DIAGNOSIS — L02419 Cutaneous abscess of limb, unspecified: Secondary | ICD-10-CM

## 2023-09-21 MED ORDER — SULFAMETHOXAZOLE-TRIMETHOPRIM 800-160 MG PO TABS: 1.0000 | ORAL_TABLET | Freq: Two times a day (BID) | ORAL | 0 refills | Status: AC

## 2023-09-21 NOTE — ED Triage Notes (Signed)
 Pt presents touc with co left leg abscess to back of thigh for 3 days. Pt has attempted otc antibiotic creams without improvement.

## 2023-09-21 NOTE — Discharge Instructions (Addendum)
 Warm compresses will help Take 2 antibiotics tonight with food, then Take the antibiotic 2 x a day for a week Take tylenol or ibuprofen for pain

## 2023-11-26 ENCOUNTER — Ambulatory Visit
Admission: EM | Admit: 2023-11-26 | Discharge: 2023-11-26 | Disposition: A | Discharge status: Home / Self Care | Attending: Family Medicine | Admitting: Family Medicine

## 2023-11-26 DIAGNOSIS — J029 Acute pharyngitis, unspecified: Secondary | ICD-10-CM | POA: Diagnosis not present

## 2023-11-26 DIAGNOSIS — J039 Acute tonsillitis, unspecified: Secondary | ICD-10-CM

## 2023-11-26 MED ORDER — PREDNISONE 20 MG PO TABS: ORAL_TABLET | ORAL | 0 refills | Status: DC

## 2023-11-26 MED ORDER — AZITHROMYCIN 250 MG PO TABS: 250.0000 mg | ORAL_TABLET | Freq: Every day | ORAL | 0 refills | Status: DC
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
## 2023-11-26 NOTE — Discharge Instructions (Addendum)
 Advised patient to take medications as directed with food to completion.  Advised patient to take prednisone  with Zithromax  daily for the next 5 days.  Encouraged to increase daily water intake to 64 ounces per day while taking these medications.  Advised if symptoms worsen and/or unresolved please follow-up with your PCP or here for further evaluation.
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
## 2023-12-06 ENCOUNTER — Ambulatory Visit
Admission: EM | Admit: 2023-12-06 | Discharge: 2023-12-06 | Disposition: A | Discharge status: Home / Self Care | Attending: Family Medicine | Admitting: Family Medicine

## 2023-12-06 ENCOUNTER — Other Ambulatory Visit: Payer: Self-pay

## 2023-12-06 VITALS — BP 123/79 | HR 69 | Temp 98.9°F | Resp 17

## 2023-12-06 DIAGNOSIS — J029 Acute pharyngitis, unspecified: Secondary | ICD-10-CM

## 2023-12-06 LAB — POCT RAPID STREP A (OFFICE): Rapid Strep A Screen: NEGATIVE

## 2023-12-06 MED ORDER — PREDNISONE 20 MG PO TABS: ORAL_TABLET | ORAL | 0 refills | Status: AC

## 2023-12-06 NOTE — Discharge Instructions (Addendum)
 Advised patient rapid strep was negative today.  Advised take medication as directed with food to completion.  Encouraged to increase daily water intake to 64 ounces per day while taking this medication.  Advised if symptoms worsen and/or unresolved please follow-up with your ENT for further evaluation.

## 2023-12-06 NOTE — ED Provider Notes (Signed)
 [facility]    CSN: [id number] Arrival date & time: 12/06/23  9171      History   Chief Complaint Chief Complaint  Patient presents with   Sore Throat    HPI Mark Lutz is a 28 y.o. male.   HPI  History reviewed. No pertinent past medical history.  Patient Active Problem List   Diagnosis Date Noted   Chronic pain of right wrist 11/02/2020    History reviewed. No pertinent surgical history.     Home Medications    Prior to Admission medications   Medication Sig Start Date End Date Taking? Authorizing Provider  predniSONE  (DELTASONE ) 20 MG tablet Take 3 tabs PO daily x 5 days. 12/06/23  Yes Teddy Sharper, FNP    Family History Family History  Problem Relation Age of Onset   Healthy Mother    Diabetes Father     Social History Social History   Tobacco Use   Smoking status: Never   Smokeless tobacco: Never  Vaping Use   Vaping status: Never Used  Substance Use Topics   Alcohol use: Yes    Comment: occ     Allergies   Patient has no known allergies.   Review of Systems Review of Systems   Physical Exam Triage Vital Signs ED Triage Vitals  Encounter Vitals Group     BP 12/06/23 0855 123/79     Girls Systolic BP Percentile --      Girls Diastolic BP Percentile --      Boys Systolic BP Percentile --      Boys Diastolic BP Percentile --      Pulse Rate 12/06/23 0855 69     Resp 12/06/23 0855 17     Temp 12/06/23 0855 98.9 F (37.2 C)     Temp Source 12/06/23 0855 Oral     SpO2 12/06/23 0855 98 %     Weight --      Height --      Head Circumference --      Peak Flow --      Pain Score 12/06/23 0856 1     Pain Loc --      Pain Education --      Exclude from Growth Chart --    No data found.  Updated Vital Signs BP 123/79 (BP Location: Right Arm)   Pulse 69   Temp 98.9 F (37.2 C) (Oral)   Resp 17   SpO2 98%       Physical Exam Vitals and nursing note reviewed.  Constitutional:      Appearance: He is  well-developed and normal weight.  HENT:     Head: Normocephalic and atraumatic.     Right Ear: Tympanic membrane and ear canal normal.     Left Ear: Tympanic membrane and ear canal normal.     Mouth/Throat:     Mouth: Mucous membranes are moist.     Pharynx: Oropharynx is clear. Uvula midline. No pharyngeal swelling, posterior oropharyngeal erythema or uvula swelling.  Eyes:     Conjunctiva/sclera: Conjunctivae normal.     Pupils: Pupils are equal, round, and reactive to light.  Cardiovascular:     Rate and Rhythm: Normal rate and regular rhythm.     Heart sounds: Normal heart sounds.  Pulmonary:     Effort: Pulmonary effort is normal.     Breath sounds: Normal breath sounds. No wheezing, rhonchi or rales.  Musculoskeletal:     Cervical back: Normal range of  motion and neck supple.  Skin:    General: Skin is warm and dry.  Neurological:     General: No focal deficit present.     Mental Status: He is alert and oriented to person, place, and time.      UC Treatments / Results  Labs (all labs ordered are listed, but only abnormal results are displayed) Labs Reviewed  POCT RAPID STREP A (OFFICE) - Normal    EKG   Radiology No results found.  Procedures Procedures (including critical care time)  Medications Ordered in UC Medications - No data to display  Initial Impression / Assessment and Plan / UC Course  I have reviewed the triage vital signs and the nursing notes.  Pertinent labs & imaging results that were available during my care of the patient were reviewed by me and considered in my medical decision making (see chart for details).     MDM: 1.  Sore throat-rapid strep negative, Rx'd prednisone  20 mg tablet: Take 3 tabs p.o. daily x 5 days. Advised patient rapid strep was negative today.  Advised take medication as directed with food to completion.  Encouraged to increase daily water intake to 64 ounces per day while taking this medication.  Advised if symptoms  worsen and/or unresolved please follow-up with your ENT for further evaluation.  Final Clinical Impressions(s) / UC Diagnoses   Final diagnoses:  Sore throat     Discharge Instructions      Advised patient rapid strep was negative today.  Advised take medication as directed with food to completion.  Encouraged to increase daily water intake to 64 ounces per day while taking this medication.  Advised if symptoms worsen and/or unresolved please follow-up with your ENT for further evaluation.     ED Prescriptions     Medication Sig Dispense Auth. Provider   predniSONE  (DELTASONE ) 20 MG tablet Take 3 tabs PO daily x 5 days. 15 tablet Tsering Leaman, FNP      PDMP not reviewed this encounter.   Teddy Sharper, FNP 12/06/23 [id number]

## 2023-12-06 NOTE — ED Triage Notes (Signed)
 Pt c/o recurrent sore throat since Wed night. Was seen in UC 10 days ago, dx with strep. Tx with abx and steroids. Has appt with ENT on 7/25.

## 2024-01-27 ENCOUNTER — Encounter: Payer: Self-pay | Admitting: Family Medicine

## 2024-01-27 ENCOUNTER — Ambulatory Visit: Admitting: Family Medicine

## 2024-01-27 VITALS — BP 107/61 | HR 60 | Temp 97.8°F | Resp 18 | Ht 72.0 in | Wt 176.2 lb

## 2024-01-27 DIAGNOSIS — Z7689 Persons encountering health services in other specified circumstances: Secondary | ICD-10-CM | POA: Diagnosis not present

## 2024-01-27 DIAGNOSIS — J302 Other seasonal allergic rhinitis: Secondary | ICD-10-CM

## 2024-01-27 DIAGNOSIS — J0391 Acute recurrent tonsillitis, unspecified: Secondary | ICD-10-CM

## 2024-01-27 MED ORDER — MONTELUKAST SODIUM 10 MG PO TABS: 10.0000 mg | ORAL_TABLET | Freq: Every day | ORAL | 3 refills | Status: AC

## 2024-01-27 NOTE — Progress Notes (Signed)
 New Patient Office Visit  Subjective    Patient ID: Mark Lutz, male    DOB: [birth year]  Age: 28 y.o. MRN: [id number]  CC:  Chief Complaint  Patient presents with   Establish Care    HPI Mark Lutz presents to establish care. Pt is new to me.  Pt reports he had recurrent sore throat and swollen tonsils in the Spring of this year. He reports it went away by itself. He went to urgent care and was diagnosed with strep in July 2025.  He says he's been to urgent care 2-3 times for this. He was given Azithromycin  and Prednisone . This helped but 2-3 weeks later, the symptoms returned with sore throat and swollen tonsils. He was seen by ENT and given another course of antibiotics. He says this temporarily helped. He says he's had 2 more episodes of sore throat and tonsillar hypertrophy. He reports a hx of recurrent strep throat as a child.  He reports a hx of seasonal allergies but not regularly taking anything.    Outpatient Encounter Medications as of 01/27/2024  Medication Sig   predniSONE  (DELTASONE ) 20 MG tablet Take 3 tabs PO daily x 5 days. (Patient not taking: Reported on 01/27/2024)   No facility-administered encounter medications on file as of 01/27/2024.    History reviewed. No pertinent past medical history.  History reviewed. No pertinent surgical history.  Family History  Problem Relation Age of Onset   Healthy Mother    Diabetes Father     Social History   Socioeconomic History   Marital status: Single    Spouse name: Not on file   Number of children: Not on file   Years of education: Not on file   Highest education level: Not on file  Occupational History   Not on file  Tobacco Use   Smoking status: Never    Passive exposure: Never   Smokeless tobacco: Never  Vaping Use   Vaping status: Never Used  Substance and Sexual Activity   Alcohol use: Yes    Comment: occ   Drug use: Never   Sexual activity: Not Currently    Birth control/protection:  Condom  Other Topics Concern   Not on file  Social History Narrative   Not on file   Social Drivers of Health   Financial Resource Strain: Not on file  Food Insecurity: No Food Insecurity (04/30/2021)   Received from [facility]   Hunger Vital Sign    Within the past 12 months, you worried that your food would run out before you got the money to buy more.: Never true    Within the past 12 months, the food you bought just didn't last and you didn't have money to get more.: Never true  Transportation Needs: Not on file  Physical Activity: Not on file  Stress: Not on file  Social Connections: Not on file  Intimate Partner Violence: Not on file    Review of Systems  HENT:  Positive for sore throat.   All other systems reviewed and are negative.       Objective    BP 107/61   Pulse 60   Temp 97.8 F (36.6 C) (Oral)   Resp 18   Ht 6' (1.829 m)   Wt 176 lb 3.2 oz (79.9 kg)   SpO2 98%   BMI 23.90 kg/m   Physical Exam Vitals and nursing note reviewed.  Constitutional:      Appearance: Normal appearance. He is  normal weight.  HENT:     Head: Normocephalic and atraumatic.     Right Ear: Tympanic membrane, ear canal and external ear normal.     Left Ear: Tympanic membrane, ear canal and external ear normal.     Nose: Nose normal.     Mouth/Throat:     Mouth: Mucous membranes are moist.     Pharynx: Oropharynx is clear.     Comments: Tonsillar hypertrophy Eyes:     Conjunctiva/sclera: Conjunctivae normal.     Pupils: Pupils are equal, round, and reactive to light.  Cardiovascular:     Rate and Rhythm: Normal rate and regular rhythm.     Pulses: Normal pulses.     Heart sounds: Normal heart sounds.  Pulmonary:     Effort: Pulmonary effort is normal.     Breath sounds: Normal breath sounds.  Abdominal:     General: Abdomen is flat. Bowel sounds are normal.  Skin:    General: Skin is warm.     Capillary Refill: Capillary refill takes less than 2 seconds.   Neurological:     General: No focal deficit present.     Mental Status: He is alert and oriented to person, place, and time. Mental status is at baseline.  Psychiatric:        Mood and Affect: Mood normal.        Behavior: Behavior normal.        Thought Content: Thought content normal.        Judgment: Judgment normal.      Assessment & Plan:   Problem List Items Addressed This Visit   None Encounter to establish care with new doctor  Recurrent tonsillitis -     Mononucleosis screen -     EBV ab to viral capsid ag pnl, IgG+IgM -     Montelukast  Sodium; Take 1 tablet (10 mg total) by mouth at bedtime.  Dispense: 30 tablet; Refill: 3  Seasonal allergies -     Montelukast  Sodium; Take 1 tablet (10 mg total) by mouth at bedtime.  Dispense: 30 tablet; Refill: 3   Pt with recurrent tonsillitis. Screen mono and due to hx of allergic rhinitis. Trial of Singulair  10mg  at bedtime. Advised to do nasal spray that was given to him by ENT.  If no better in 4-6 weeks with this treatment, advised pt to go back and see ENT for consultation for possible tonsillectomy.   No follow-ups on file.   Torrence CINDERELLA Barrier, MD

## 2024-01-29 ENCOUNTER — Ambulatory Visit: Payer: Self-pay | Admitting: Family Medicine

## 2024-01-29 LAB — EBV AB TO VIRAL CAPSID AG PNL, IGG+IGM
EBV VCA IgG: 248 U/mL — ABNORMAL HIGH (ref 0.0–17.9)
EBV VCA IgM: <36 U/mL (ref 0.0–35.9)

## 2024-01-29 LAB — MONONUCLEOSIS SCREEN: Mono Screen: NEGATIVE

## 2024-07-26 ENCOUNTER — Ambulatory Visit: Admitting: Family Medicine
